# Patient Record
Sex: Male | Born: 1960 | Race: White | Hispanic: No | Marital: Single | Smoking: Current every day smoker
Health system: Southern US, Community
[De-identification: ages and names within clinical notes are randomized; demographics above are authoritative.]

## PROBLEM LIST (undated history)

## (undated) DIAGNOSIS — M4800 Spinal stenosis, site unspecified: Secondary | ICD-10-CM

## (undated) DIAGNOSIS — E7800 Pure hypercholesterolemia, unspecified: Secondary | ICD-10-CM

---

## 2003-07-09 ENCOUNTER — Other Ambulatory Visit: Payer: Self-pay

## 2005-02-27 ENCOUNTER — Ambulatory Visit: Payer: Self-pay | Admitting: Family Medicine

## 2005-03-09 ENCOUNTER — Ambulatory Visit: Payer: Self-pay | Admitting: Critical Care Medicine

## 2006-07-17 ENCOUNTER — Emergency Department: Payer: Self-pay | Admitting: Internal Medicine

## 2012-08-25 ENCOUNTER — Other Ambulatory Visit: Payer: Self-pay | Admitting: Neurosurgery

## 2012-08-25 DIAGNOSIS — M549 Dorsalgia, unspecified: Secondary | ICD-10-CM

## 2012-09-02 ENCOUNTER — Ambulatory Visit
Admission: RE | Admit: 2012-09-02 | Discharge: 2012-09-02 | Disposition: A | Discharge status: Home / Self Care | Payer: BC Managed Care – PPO | Source: Ambulatory Visit | Attending: Neurosurgery | Admitting: Neurosurgery

## 2012-09-02 DIAGNOSIS — M549 Dorsalgia, unspecified: Secondary | ICD-10-CM

## 2012-09-16 ENCOUNTER — Other Ambulatory Visit: Payer: Self-pay | Admitting: Orthopedic Surgery

## 2012-09-16 DIAGNOSIS — M48061 Spinal stenosis, lumbar region without neurogenic claudication: Secondary | ICD-10-CM

## 2012-09-23 ENCOUNTER — Ambulatory Visit
Admission: RE | Admit: 2012-09-23 | Discharge: 2012-09-23 | Disposition: A | Discharge status: Home / Self Care | Payer: BC Managed Care – PPO | Source: Ambulatory Visit | Attending: Orthopedic Surgery | Admitting: Orthopedic Surgery

## 2012-09-23 VITALS — BP 114/72 | HR 70

## 2012-09-23 DIAGNOSIS — M48061 Spinal stenosis, lumbar region without neurogenic claudication: Secondary | ICD-10-CM

## 2012-09-23 MED ORDER — ONDANSETRON HCL 4 MG/2ML IJ SOLN
4.0000 mg | Freq: Once | INTRAMUSCULAR | Status: AC
  Administered 2012-09-23: 4 mg via INTRAMUSCULAR

## 2012-09-23 MED ORDER — IOHEXOL 180 MG/ML  SOLN
20.0000 mL | Freq: Once | INTRAMUSCULAR | Status: AC | PRN
  Administered 2012-09-23: 20 mL via INTRATHECAL

## 2012-09-23 MED ORDER — MEPERIDINE HCL 100 MG/ML IJ SOLN
100.0000 mg | Freq: Once | INTRAMUSCULAR | Status: AC
  Administered 2012-09-23: 100 mg via INTRAMUSCULAR

## 2012-09-23 MED ORDER — DIAZEPAM 5 MG PO TABS
10.0000 mg | ORAL_TABLET | Freq: Once | ORAL | Status: AC
  Administered 2012-09-23: 10 mg via ORAL

## 2012-09-23 NOTE — Progress Notes (Signed)
Discharge instructions explained to pt. 

## 2013-07-26 ENCOUNTER — Emergency Department: Payer: Self-pay | Admitting: Emergency Medicine

## 2013-09-21 ENCOUNTER — Ambulatory Visit: Payer: Self-pay | Admitting: Internal Medicine

## 2013-09-21 LAB — CBC WITH DIFFERENTIAL/PLATELET
Basophil #: 0.1 10*3/uL (ref 0.0–0.1)
Basophil %: 0.6 %
Eosinophil #: 0.1 10*3/uL (ref 0.0–0.7)
Eosinophil %: 0.9 %
HCT: 47.5 % (ref 40.0–52.0)
HGB: 15.6 g/dL (ref 13.0–18.0)
Lymphocyte #: 2.3 10*3/uL (ref 1.0–3.6)
Lymphocyte %: 21 %
MCH: 31.7 pg (ref 26.0–34.0)
MCHC: 32.9 g/dL (ref 32.0–36.0)
MCV: 97 fL (ref 80–100)
Monocyte #: 0.7 x10 3/mm (ref 0.2–1.0)
Monocyte %: 6.1 %
Neutrophil #: 7.6 10*3/uL — ABNORMAL HIGH (ref 1.4–6.5)
Neutrophil %: 71.4 %
Platelet: 224 10*3/uL (ref 150–440)
RBC: 4.92 10*6/uL (ref 4.40–5.90)
RDW: 13.7 % (ref 11.5–14.5)
WBC: 10.7 10*3/uL — ABNORMAL HIGH (ref 3.8–10.6)

## 2013-11-10 ENCOUNTER — Ambulatory Visit: Payer: Self-pay | Admitting: Gastroenterology

## 2017-01-07 ENCOUNTER — Other Ambulatory Visit: Payer: Self-pay

## 2017-01-07 ENCOUNTER — Emergency Department: Payer: Managed Care, Other (non HMO)

## 2017-01-07 ENCOUNTER — Emergency Department
Admission: EM | Admit: 2017-01-07 | Discharge: 2017-01-07 | Disposition: A | Discharge status: Home / Self Care | Payer: Managed Care, Other (non HMO) | Attending: Emergency Medicine | Admitting: Emergency Medicine

## 2017-01-07 DIAGNOSIS — M79641 Pain in right hand: Secondary | ICD-10-CM | POA: Diagnosis not present

## 2017-01-07 DIAGNOSIS — F17210 Nicotine dependence, cigarettes, uncomplicated: Secondary | ICD-10-CM | POA: Diagnosis not present

## 2017-01-07 DIAGNOSIS — L089 Local infection of the skin and subcutaneous tissue, unspecified: Secondary | ICD-10-CM | POA: Diagnosis not present

## 2017-01-07 DIAGNOSIS — R2231 Localized swelling, mass and lump, right upper limb: Secondary | ICD-10-CM | POA: Insufficient documentation

## 2017-01-07 DIAGNOSIS — M7989 Other specified soft tissue disorders: Secondary | ICD-10-CM

## 2017-01-07 HISTORY — DX: Spinal stenosis, site unspecified: M48.00

## 2017-01-07 HISTORY — DX: Pure hypercholesterolemia, unspecified: E78.00

## 2017-01-07 MED ORDER — KETOROLAC TROMETHAMINE 30 MG/ML IJ SOLN
30.0000 mg | Freq: Once | INTRAMUSCULAR | Status: AC
  Administered 2017-01-07: 30 mg via INTRAMUSCULAR
  Filled 2017-01-07: qty 1

## 2017-01-07 MED ORDER — KETOROLAC TROMETHAMINE 10 MG PO TABS: 10.0000 mg | ORAL_TABLET | Freq: Four times a day (QID) | ORAL | 0 refills | Status: DC | PRN

## 2017-01-07 MED ORDER — CEPHALEXIN 500 MG PO CAPS: 500.0000 mg | ORAL_CAPSULE | Freq: Four times a day (QID) | ORAL | 0 refills | Status: AC

## 2017-01-07 MED ORDER — KETOROLAC TROMETHAMINE 10 MG PO TABS: 10.0000 mg | ORAL_TABLET | Freq: Four times a day (QID) | ORAL | 0 refills | Status: AC | PRN

## 2017-01-07 NOTE — ED Provider Notes (Signed)
[facility] Emergency Department Provider Note   ____________________________________________   I have reviewed the triage vital signs and the nursing notes.   HISTORY  Chief Complaint Hand Pain    HPI Jacob Calhoun is a 56 y.o. male presents emergency department with right hand pain localized along the dorsal aspect of the hand without traumatic injury.  Patient reports onset of pain 6 days ago when he awoke Saturday morning.  Patient denies any unusual activity during his workday Friday and no right hand pain.  Patient reports increased pain with flexion and extension of the right fingers localized over the extensor tendons of the right hand.  Patient reports swelling over the dorsal aspect of the hand with mild redness.  Patient also reports small cut and abrasion he sustained 2-1/2 weeks ago while working on a piece of equipment a small scab persists since the injury.  Patient reports that injury has been healing normally without complication.  Patient reports it is been 3 years since his last tetanus vaccination.  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  Patient denies any right elbow or shoulder pain, fevers, chills, nausea, vomiting or headache since onset of the right hand pain. Patient denies vision changes, chest pain, chest tightness, shortness of breath or abdominal pain.  Past Medical History:  Diagnosis Date  . Hypercholesteremia   . Spinal stenosis     There are no active problems to display for this patient.   History reviewed. No pertinent surgical history.  Prior to Admission medications   Medication Sig Start Date End Date Taking? Authorizing Provider  cephALEXin (KEFLEX) 500 MG capsule Take 1 capsule (500 mg total) 4 (four) times daily for 7 days by mouth. 01/07/17 01/14/17  Little, Traci M, PA-C  ketorolac (TORADOL) 10 MG tablet Take 1 tablet (10 mg total) every 6  (six) hours as needed for up to 5 days by mouth. 01/07/17 01/12/17  Little, Traci M, PA-C    Allergies Patient has no known allergies.  No family history on file.  Social History Social History   Tobacco Use  . Smoking status: Current Every Day Smoker    Packs/day: 1.00    Years: 18.00    Pack years: 18.00    Types: Cigarettes  . Smokeless tobacco: Never Used  Substance Use Topics  . Alcohol use: No    Frequency: Never  . Drug use: Not on file    Review of Systems Constitutional: Negative for fever/chills Eyes: No visual changes. Cardiovascular: Denies chest pain. Respiratory: Denies shortness of breath. Musculoskeletal: Positive for right hand pain along the dorsal aspect. Skin: Negative for rash. Neurological: Negative for headaches. ____________________________________________   PHYSICAL EXAM:  VITAL SIGNS: ED Triage Vitals [01/07/17 1341]  Enc Vitals Group     BP 122/85     Pulse Rate 90     Resp 18     Temp 98.8 F (37.1 C)     Temp src      SpO2 97 %     Weight 208 lb (94.3 kg)     Height 5\' 10"  (1.778 m)     Head Circumference      Peak Flow      Pain Score      Pain Loc      Pain Edu?      Excl. in GC?     Constitutional: Alert and oriented. Well appearing and in  no acute distress.  Eyes: Conjunctivae are normal. PERRL. Head: Normocephalic and atraumatic. Neck:Supple.  Cardiovascular: Normal rate, regular rhythm. Good peripheral circulation. Respiratory: Normal respiratory effort without tachypnea or retractions. Musculoskeletal: Right hand range of motion, all planes intact although flexion and extension of fingers painful, localized over extensor tendons.  Palpable tenderness noted over extensor tendons with visible swelling and mild erythema.  Healing wound with small scab noted over the dorsal aspect of the right hand.  Intact sensation and strength of the right hand limited by subjective pain. Neurologic: Normal speech and language.  Skin:   Skin is warm, dry and intact. No rash noted. Psychiatric: Mood and affect are normal. Speech and behavior are normal. Patient exhibits appropriate insight and judgement.  ____________________________________________   LABS (all labs ordered are listed, but only abnormal results are displayed)  Labs Reviewed - No data to display ____________________________________________  EKG none ____________________________________________  RADIOLOGY DG right hand complete  IMPRESSION: Mild soft tissue swelling without bony abnormality. ____________________________________________   PROCEDURES  Procedure(s) performed: no    Critical Care performed: no ____________________________________________   INITIAL IMPRESSION / ASSESSMENT AND PLAN / ED COURSE  Pertinent labs & imaging results that were available during my care of the patient were reviewed by me and considered in my medical decision making (see chart for details).   Patient presents to emergency department with right hand pain localized along the dorsal aspect of the hand without traumatic injury. History, physical exam findings and imaging are consistent with likely skin infection with localized swelling.  Imaging unremarkable for fracture, subluxation, dislocation or osteomyelitis.  Patient noted reduction of pain following Toradol given during the course of care in the emergency department. Patient will be prescribed a course of cephalexin for antibiotic coverage and course of Toradol for pain and inflammation.  Patient advised to follow up with orthopedics if symptoms do not fully resolve or return to the emergency department if symptoms return if symptoms significantly worsen.  Reassessment patient was reassuring at time of discharge.  Patient informed of clinical course, understand medical decision-making process, and agree with plan.  ____________________________________________   FINAL CLINICAL IMPRESSION(S) / ED  DIAGNOSES  Final diagnoses:  Right hand pain  Swelling of right hand  Skin infection       NEW MEDICATIONS STARTED DURING THIS VISIT:  This SmartLink is deprecated. Use AVSMEDLIST instead to display the medication list for a patient.   Note:  This document was prepared using Dragon voice recognition software and may include unintentional dictation errors.    Clois ComberLittle, Traci M, PA-C 01/07/17 1526    Emily FilbertWilliams, Jonathan E, MD 01/07/17 [phone]

## 2017-01-07 NOTE — ED Notes (Signed)
Pt was ambulatory to POV without difficulty. NAD. VSS. Resp is non-labored and equal. Skin PWD. No questions or concerns voiced while discharging patient.

## 2017-01-07 NOTE — ED Triage Notes (Addendum)
Right hand pain that started Sunday. No injury. Swelling and pain to top of hand. PT cannot close fist due to pain. Pt alert and oriented X4, active, cooperative, pt in NAD. RR even and unlabored, color WNL.  No hx of gout.   No pain to hand when holding upright. Dx with tendonitis at another Urgent Care this week.

## 2017-01-07 NOTE — Discharge Instructions (Addendum)
Take medication as prescribed.   You may wear the wrist splint as needed for comfort and support.  Return to emergency department if symptoms worsen  Follow-up with orthopedics if symptoms do not fully resolve.  Contact information is included in her discharge instructions.

## 2018-03-15 IMAGING — DX DG HAND COMPLETE 3+V*R*
3 series · 3 of 3 positions shown · non-contrast
Comparison: None.

CLINICAL DATA: 56-year-old male with acute right hand pain and
swelling for 4 days. No known injury. Initial encounter.

EXAM:
RIGHT HAND - COMPLETE 3+ VIEW

[hand ap]
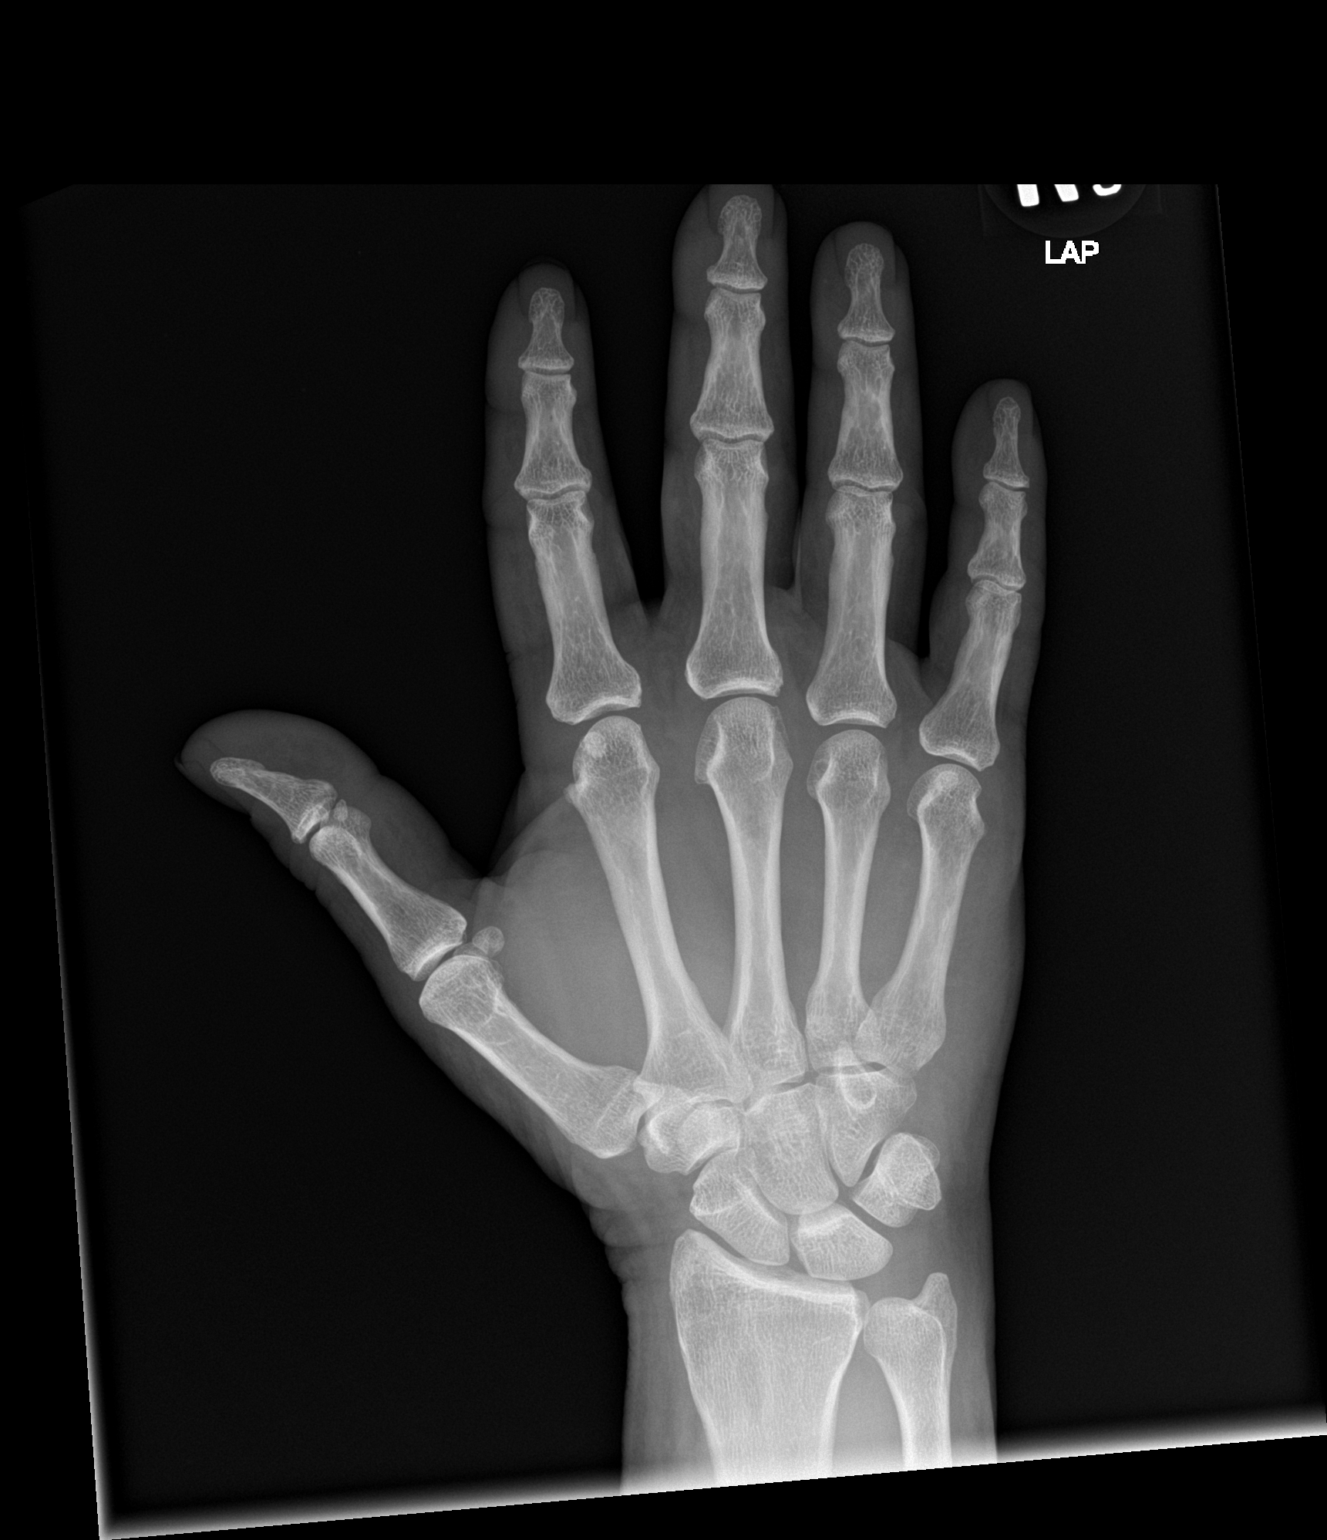

[hand obl]
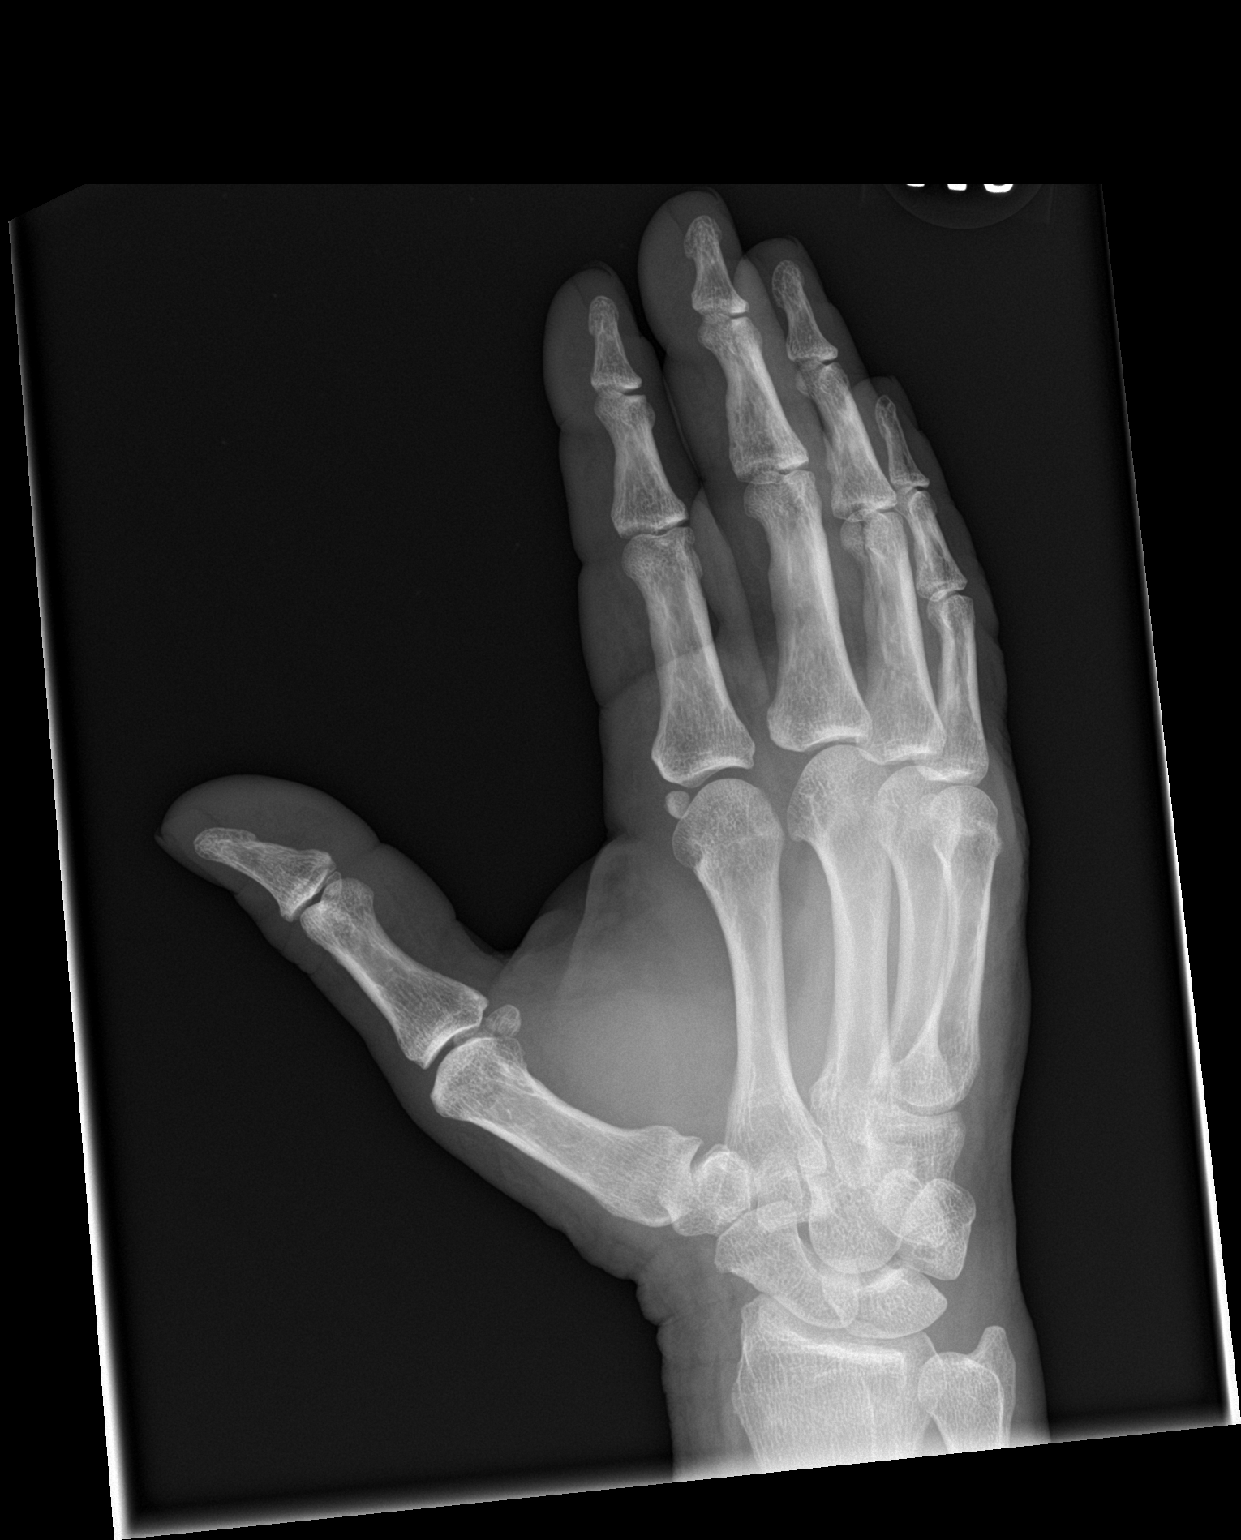

[hand lat]
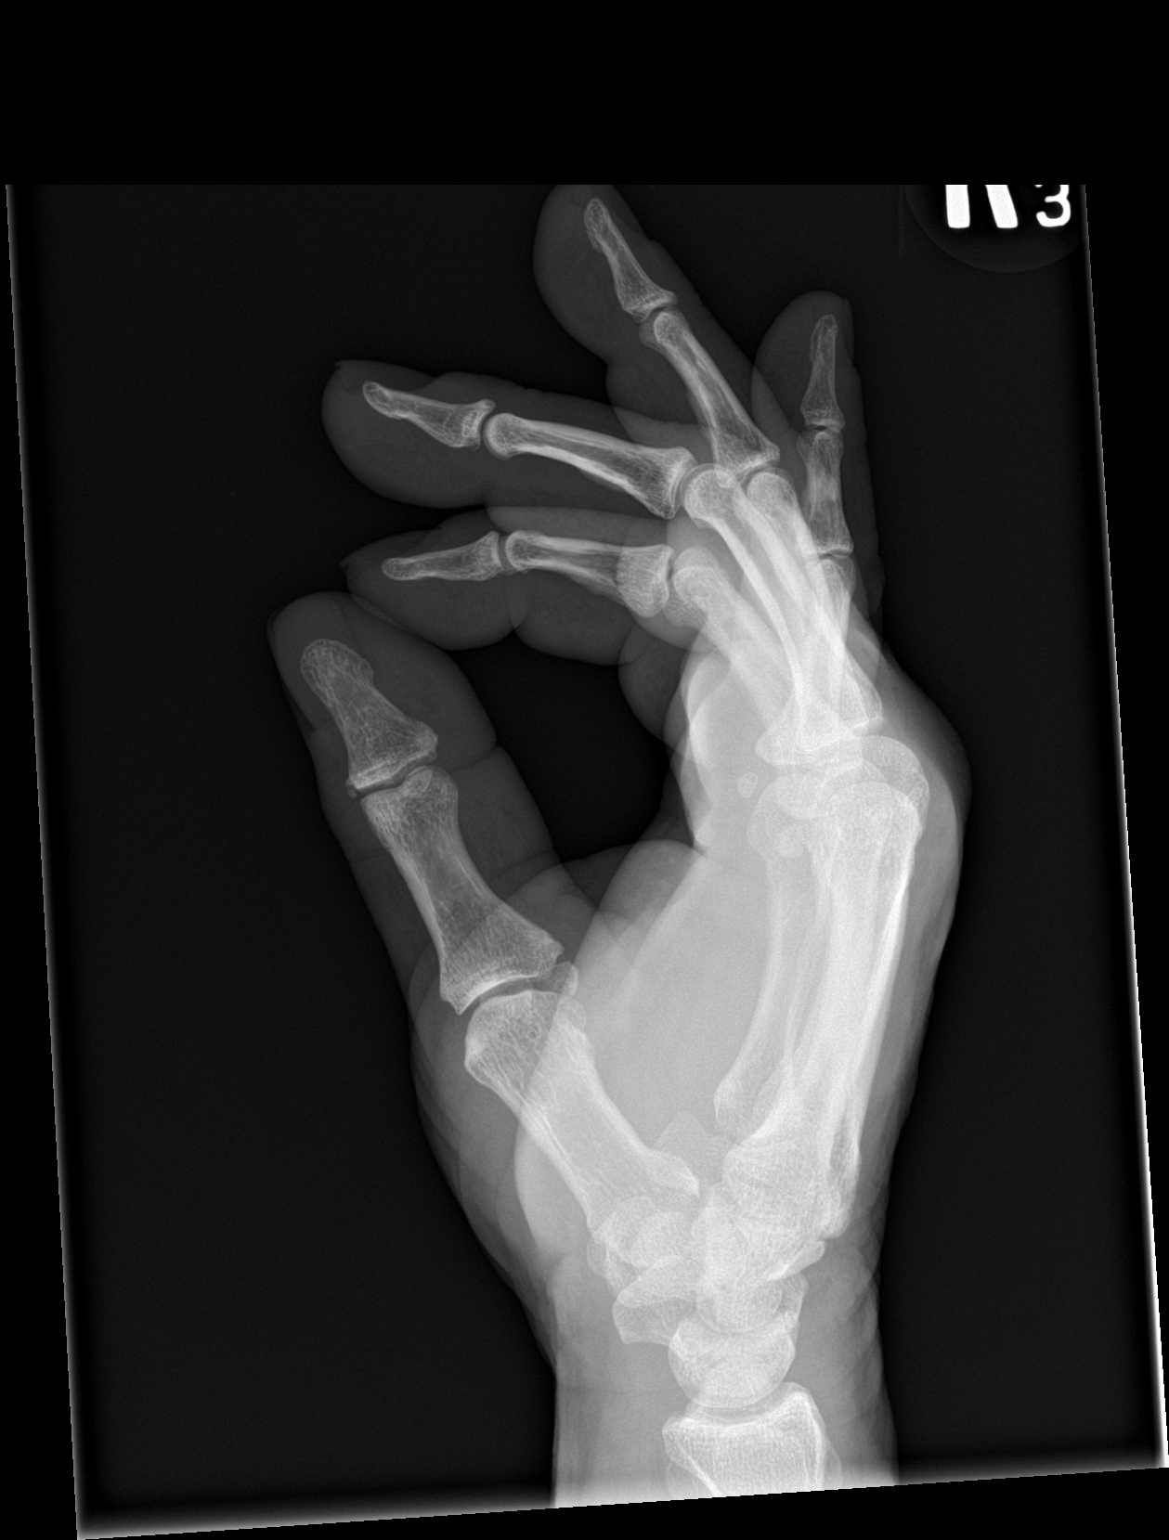

[3 of 3 positions shown; findings below may reference images not displayed]

FINDINGS: There is no evidence of fracture, subluxation or dislocation.

No focal bony lesions are present.

The joint spaces are unremarkable.

Mild dorsal soft tissue swelling noted.
IMPRESSION: Mild soft tissue swelling without bony abnormality.
